# Patient Record
Sex: Male | Born: 1980 | Race: Black or African American | Hispanic: No | Marital: Single | Smoking: Never smoker
Health system: Southern US, Community
[De-identification: ages and names within clinical notes are randomized; demographics above are authoritative.]

## PROBLEM LIST (undated history)

## (undated) DIAGNOSIS — R569 Unspecified convulsions: Secondary | ICD-10-CM

## (undated) DIAGNOSIS — J309 Allergic rhinitis, unspecified: Secondary | ICD-10-CM

## (undated) DIAGNOSIS — F6381 Intermittent explosive disorder: Secondary | ICD-10-CM

## (undated) DIAGNOSIS — H52209 Unspecified astigmatism, unspecified eye: Secondary | ICD-10-CM

## (undated) DIAGNOSIS — F71 Moderate intellectual disabilities: Secondary | ICD-10-CM

## (undated) DIAGNOSIS — F840 Autistic disorder: Secondary | ICD-10-CM

## (undated) DIAGNOSIS — E871 Hypo-osmolality and hyponatremia: Secondary | ICD-10-CM

---
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
## 2003-07-27 ENCOUNTER — Emergency Department (HOSPITAL_COMMUNITY): Admission: EM | Admit: 2003-07-27 | Discharge: 2003-07-28 | Payer: Self-pay | Admitting: Emergency Medicine

## 2003-09-26 ENCOUNTER — Ambulatory Visit (HOSPITAL_COMMUNITY): Admission: RE | Admit: 2003-09-26 | Discharge: 2003-09-26 | Payer: Self-pay | Admitting: Specialist

## 2004-07-23 ENCOUNTER — Emergency Department (HOSPITAL_COMMUNITY): Admission: EM | Admit: 2004-07-23 | Discharge: 2004-07-23 | Payer: Self-pay | Admitting: Emergency Medicine

## 2004-09-21 ENCOUNTER — Ambulatory Visit (HOSPITAL_COMMUNITY): Admission: RE | Admit: 2004-09-21 | Discharge: 2004-09-21 | Payer: Self-pay | Admitting: Specialist

## 2005-11-09 ENCOUNTER — Ambulatory Visit (HOSPITAL_COMMUNITY): Admission: RE | Admit: 2005-11-09 | Discharge: 2005-11-09 | Payer: Self-pay | Admitting: Specialist

## 2006-04-13 ENCOUNTER — Ambulatory Visit (HOSPITAL_COMMUNITY): Admission: RE | Admit: 2006-04-13 | Discharge: 2006-04-13 | Payer: Self-pay | Admitting: Specialist

## 2006-11-15 ENCOUNTER — Ambulatory Visit (HOSPITAL_COMMUNITY): Admission: RE | Admit: 2006-11-15 | Discharge: 2006-11-15 | Payer: Self-pay | Admitting: Specialist

## 2007-11-22 ENCOUNTER — Ambulatory Visit (HOSPITAL_COMMUNITY): Admission: RE | Admit: 2007-11-22 | Discharge: 2007-11-22 | Payer: Self-pay | Admitting: Specialist

## 2007-11-23 ENCOUNTER — Emergency Department (HOSPITAL_COMMUNITY): Admission: EM | Admit: 2007-11-23 | Discharge: 2007-11-23 | Payer: Self-pay | Admitting: Emergency Medicine

## 2008-02-03 ENCOUNTER — Inpatient Hospital Stay (HOSPITAL_COMMUNITY): Admission: EM | Admit: 2008-02-03 | Discharge: 2008-02-06 | Payer: Self-pay | Admitting: Emergency Medicine

## 2008-02-25 ENCOUNTER — Emergency Department (HOSPITAL_COMMUNITY): Admission: EM | Admit: 2008-02-25 | Discharge: 2008-02-25 | Payer: Self-pay | Admitting: Emergency Medicine

## 2008-09-15 IMAGING — CT CT HEAD W/O CM
1 series · 16 of 30 positions shown, 20 images · non-contrast
Comparison: 02/05/2008

CLINICAL DATA: Syncope.  History of autism.  Seizure disorder.

CT HEAD WITHOUT CONTRAST
TECHNIQUE: Contiguous axial images were obtained from the base of
the skull through the vertex without contrast.

[Series 2: head routine 4.8 h37s · axial · 0.45mm/px · z∈[-49,+83]mm · 16 of 30 slices shown, 20 images]
[im 2/30  brain]
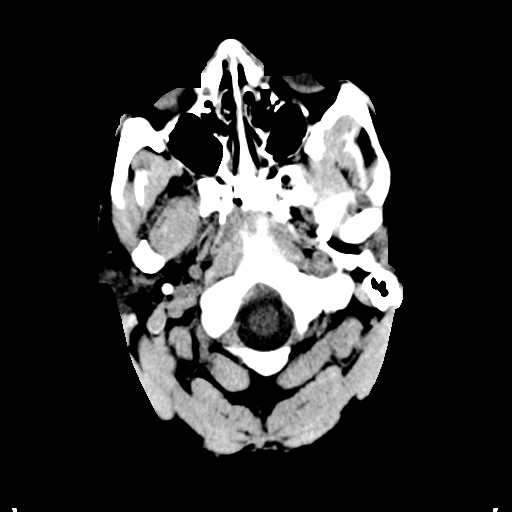
[im 2/30  bone]
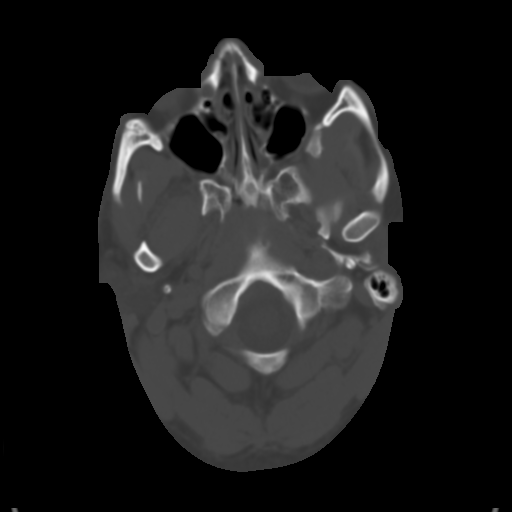
[im 4/30  brain]
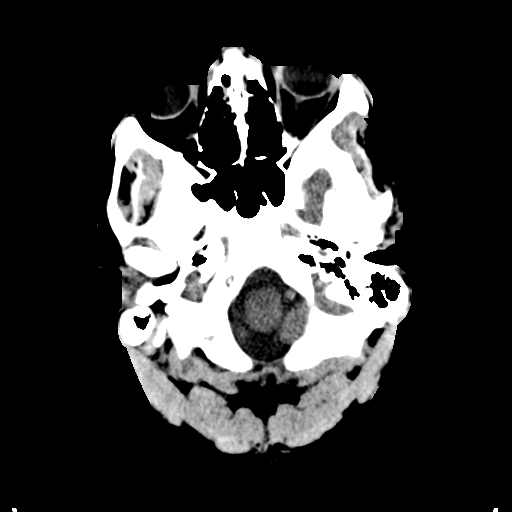
[im 6/30  brain]
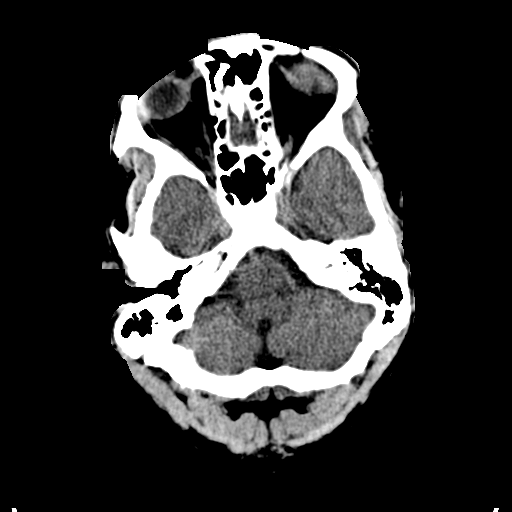
[im 8/30  brain]
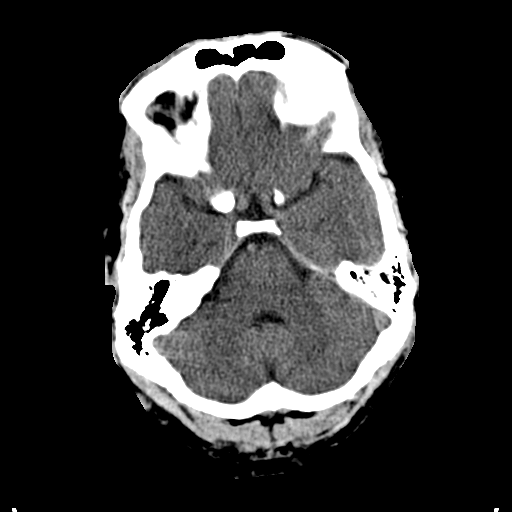
[im 9/30  brain]
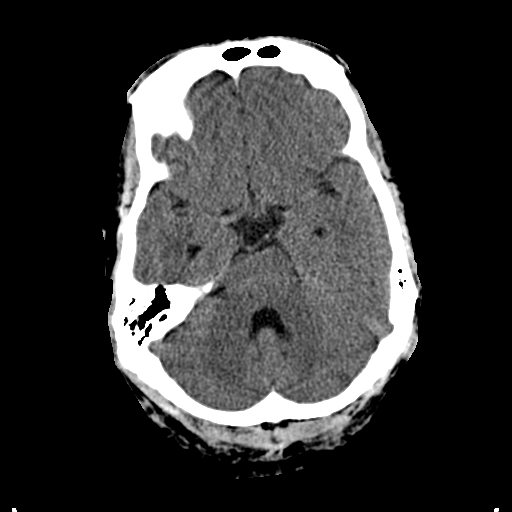
[im 9/30  bone]
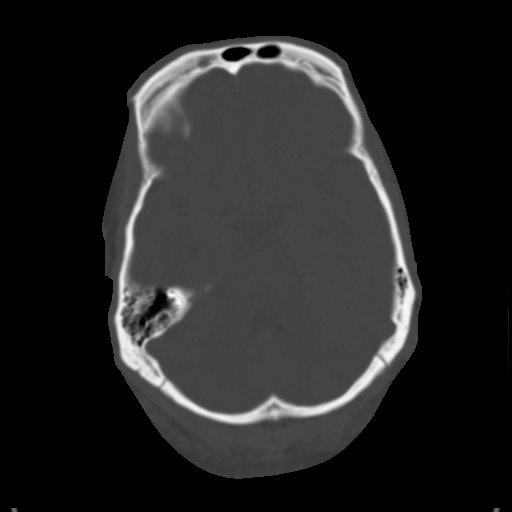
[im 11/30  brain]
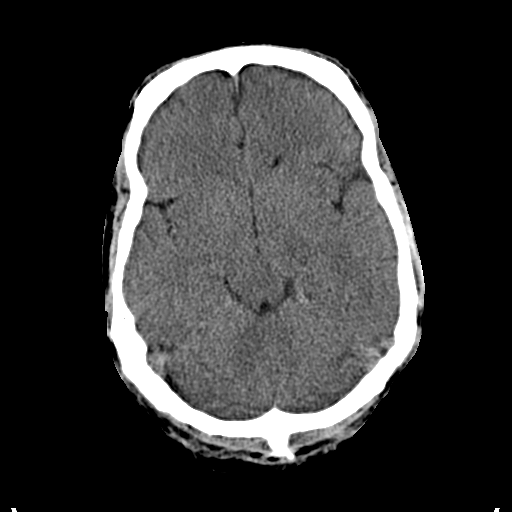
[im 13/30  brain]
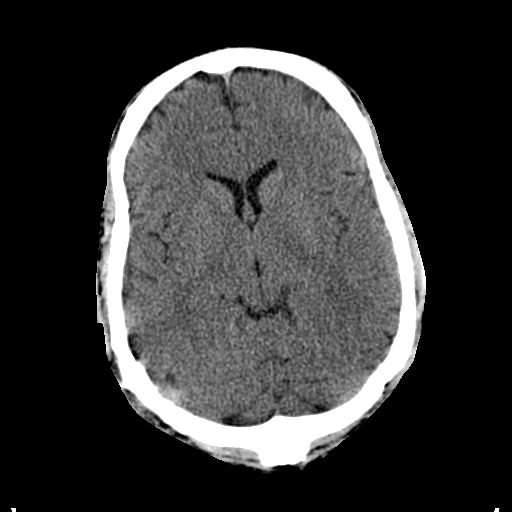
[im 15/30  brain]
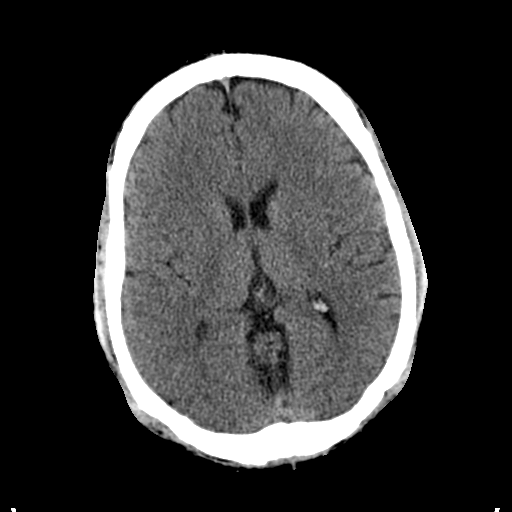
[im 16/30  brain]
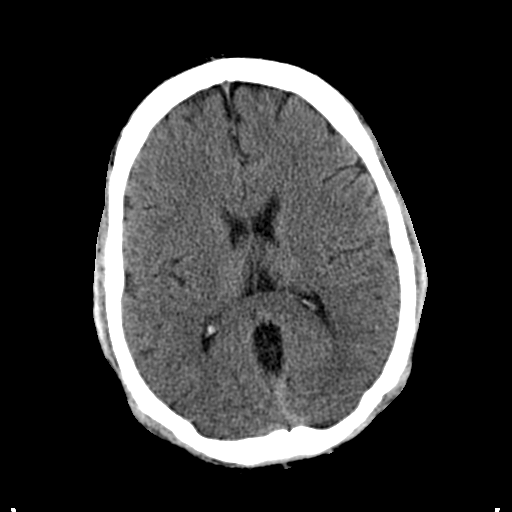
[im 16/30  bone]
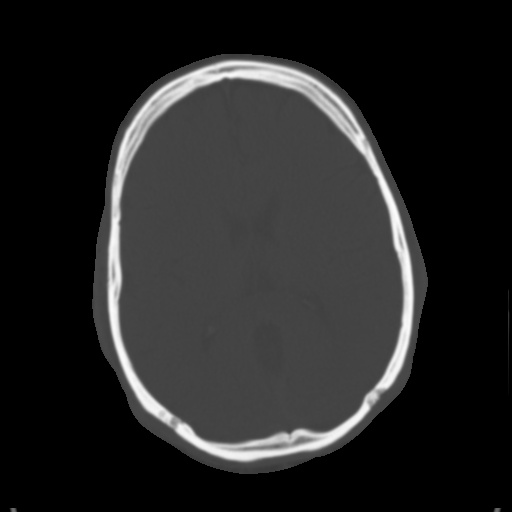
[im 18/30  brain]
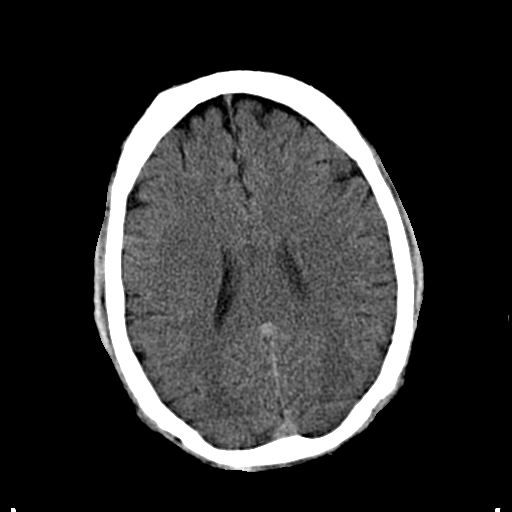
[im 20/30  brain]
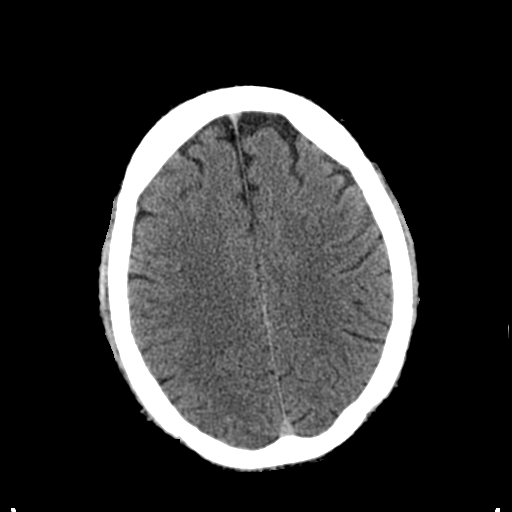
[im 22/30  brain]
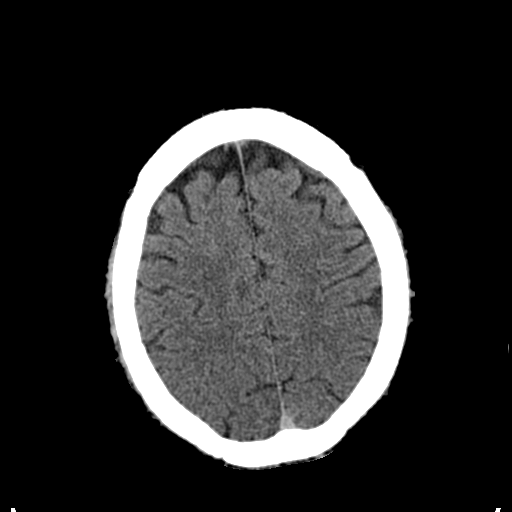
[im 23/30  brain]
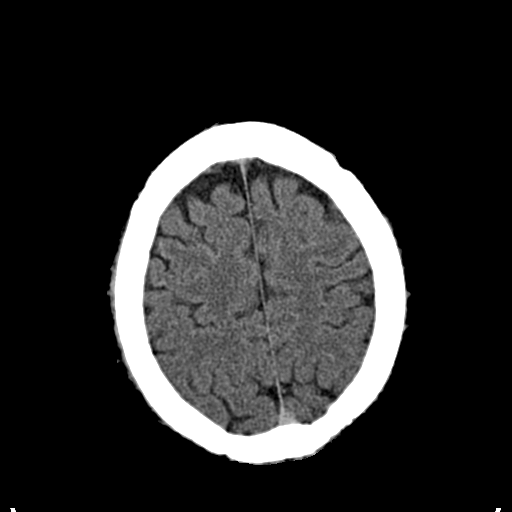
[im 23/30  bone]
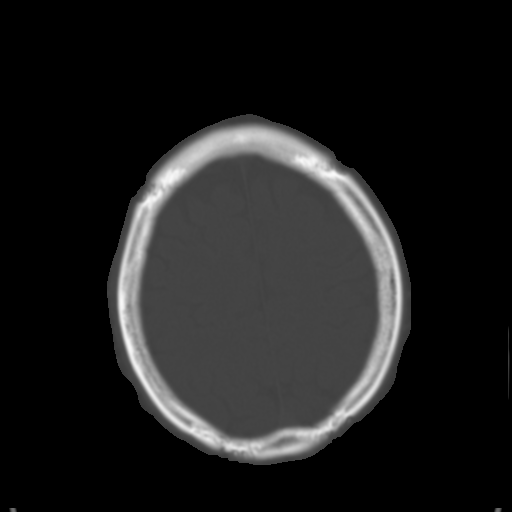
[im 25/30  brain]
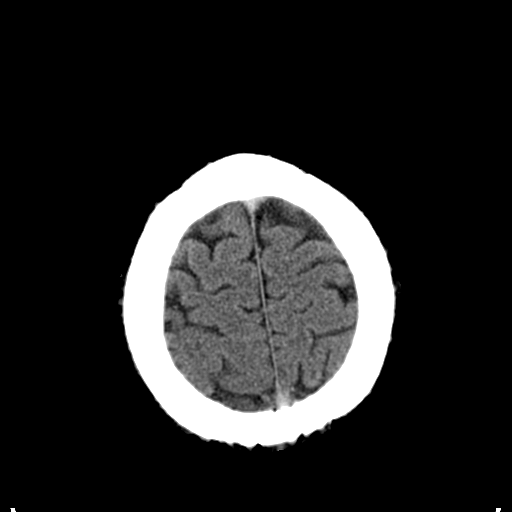
[im 27/30  brain]
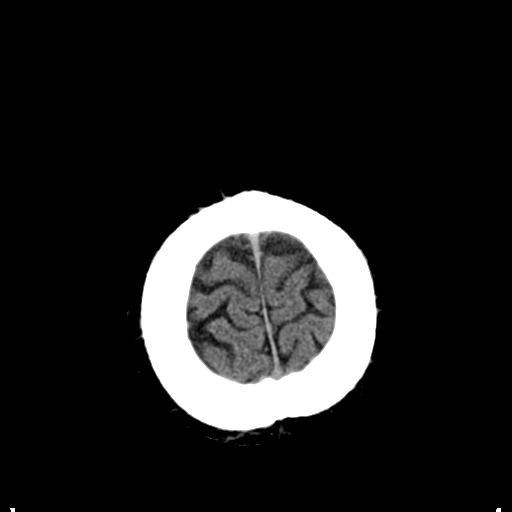
[im 29/30  brain]
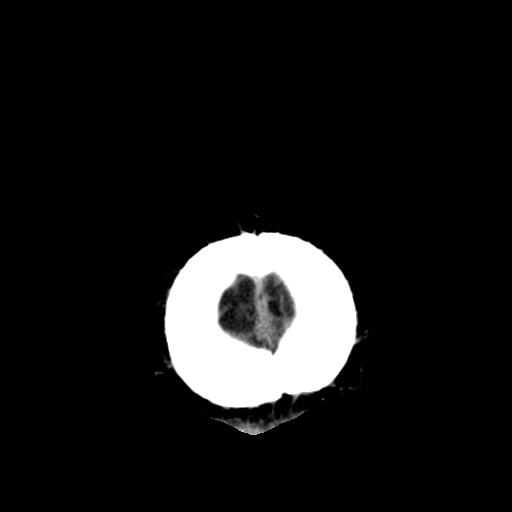

[16 of 30 positions shown; findings below may reference images not displayed]

FINDINGS: No acute intracranial abnormality is present.
Specifically, there is no evidence for acute infarct, hemorrhage,
mass, hydrocephalus, or extra-axial fluid collection.  The
paranasal sinuses and mastoid air cells are clear.  The globes and
orbits are intact.  The osseous skull is intact.
IMPRESSION: 1.  No acute intracranial abnormality or significant interval
change.

## 2009-07-13 ENCOUNTER — Emergency Department (HOSPITAL_COMMUNITY): Admission: EM | Admit: 2009-07-13 | Discharge: 2009-07-13 | Payer: Self-pay | Admitting: Emergency Medicine

## 2009-07-28 ENCOUNTER — Emergency Department (HOSPITAL_COMMUNITY): Admission: EM | Admit: 2009-07-28 | Discharge: 2009-07-28 | Payer: Self-pay | Admitting: Emergency Medicine

## 2009-11-03 ENCOUNTER — Ambulatory Visit (HOSPITAL_COMMUNITY): Admission: RE | Admit: 2009-11-03 | Discharge: 2009-11-03 | Payer: Self-pay | Admitting: Specialist

## 2010-02-01 IMAGING — CR DG ANKLE COMPLETE 3+V*R*
3 series · 3 of 3 positions shown · non-contrast
Comparison: None available.

CLINICAL DATA: Right ankle pain.

RIGHT ANKLE - COMPLETE 3+ VIEW

[t ankle joint ap right *]
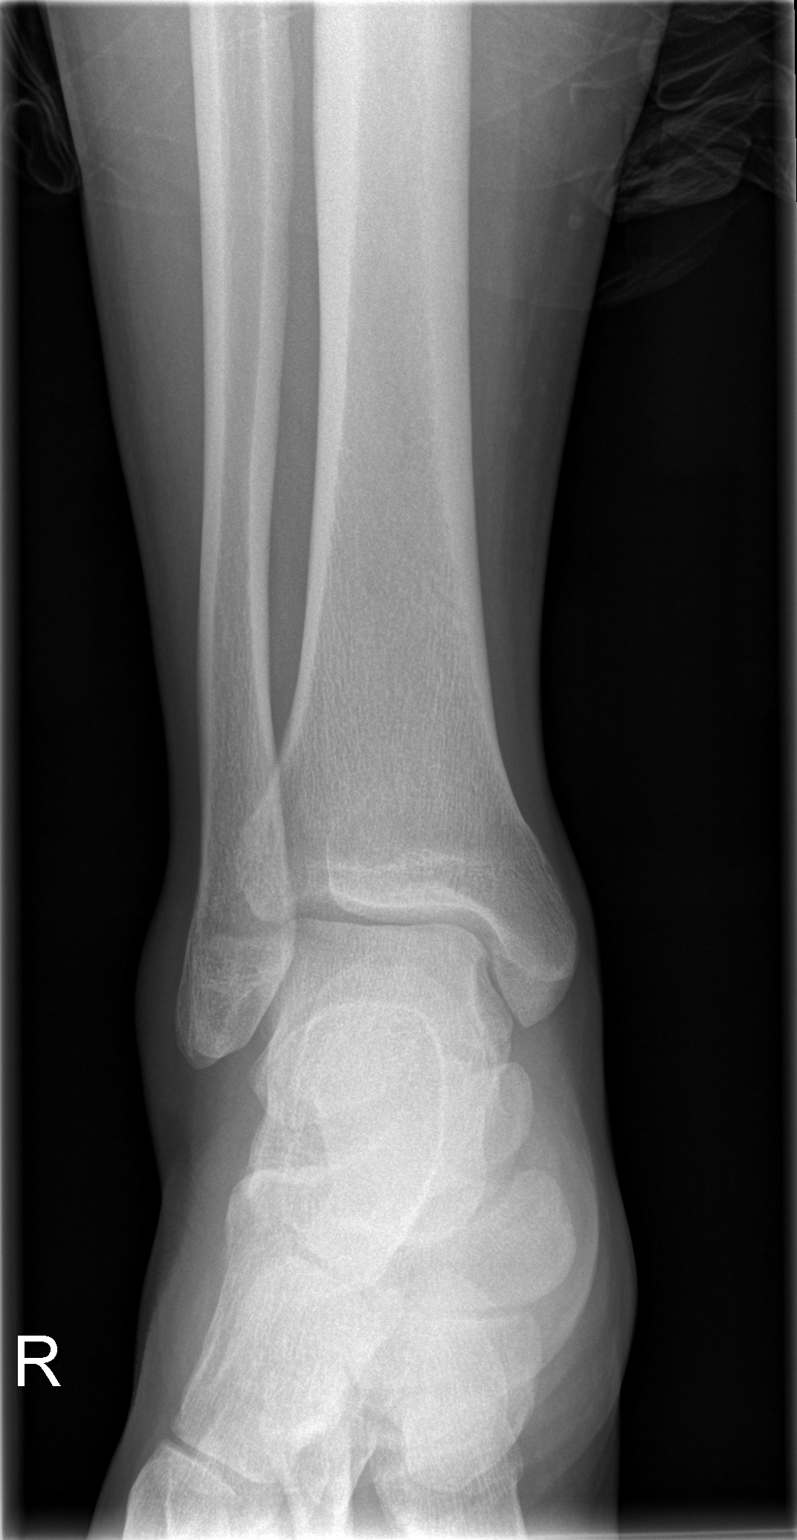

[t ankle joint oblique right *]
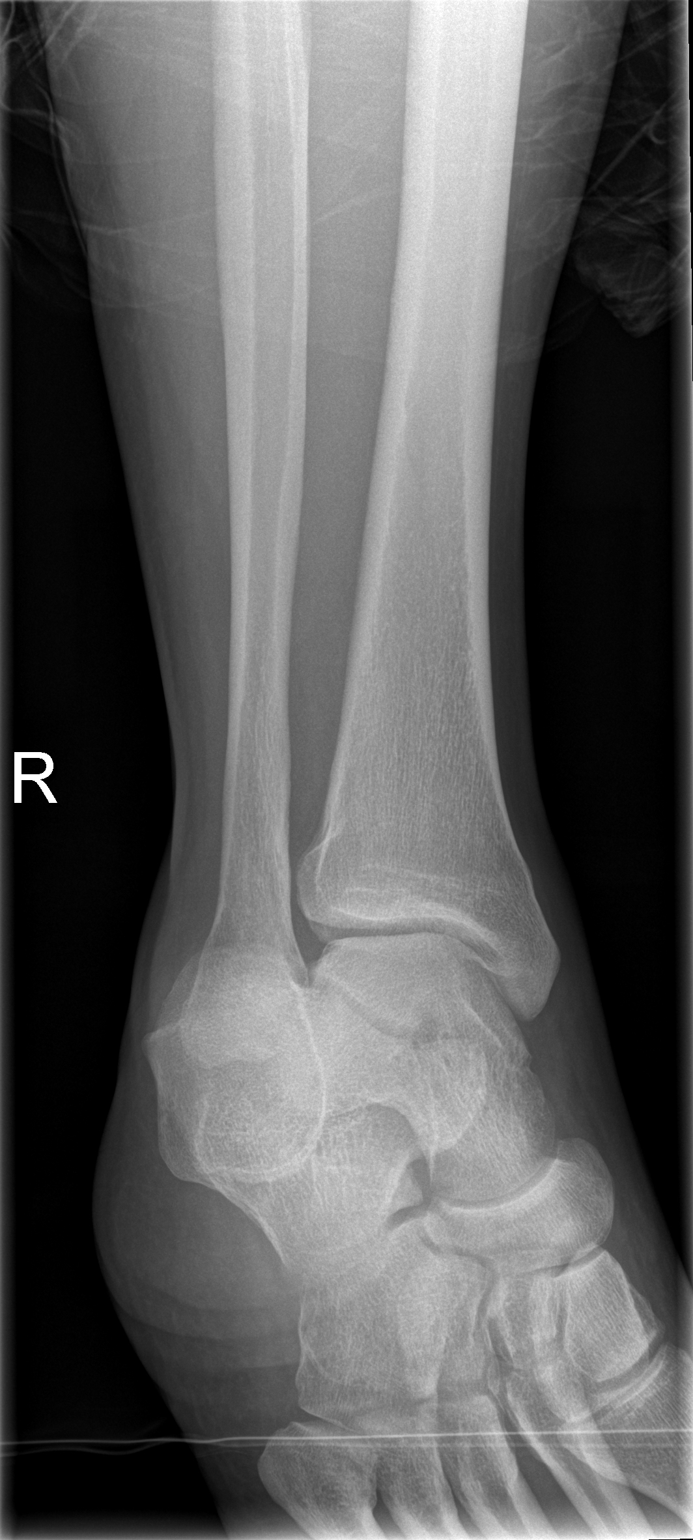

[t ankle joint lat right *]
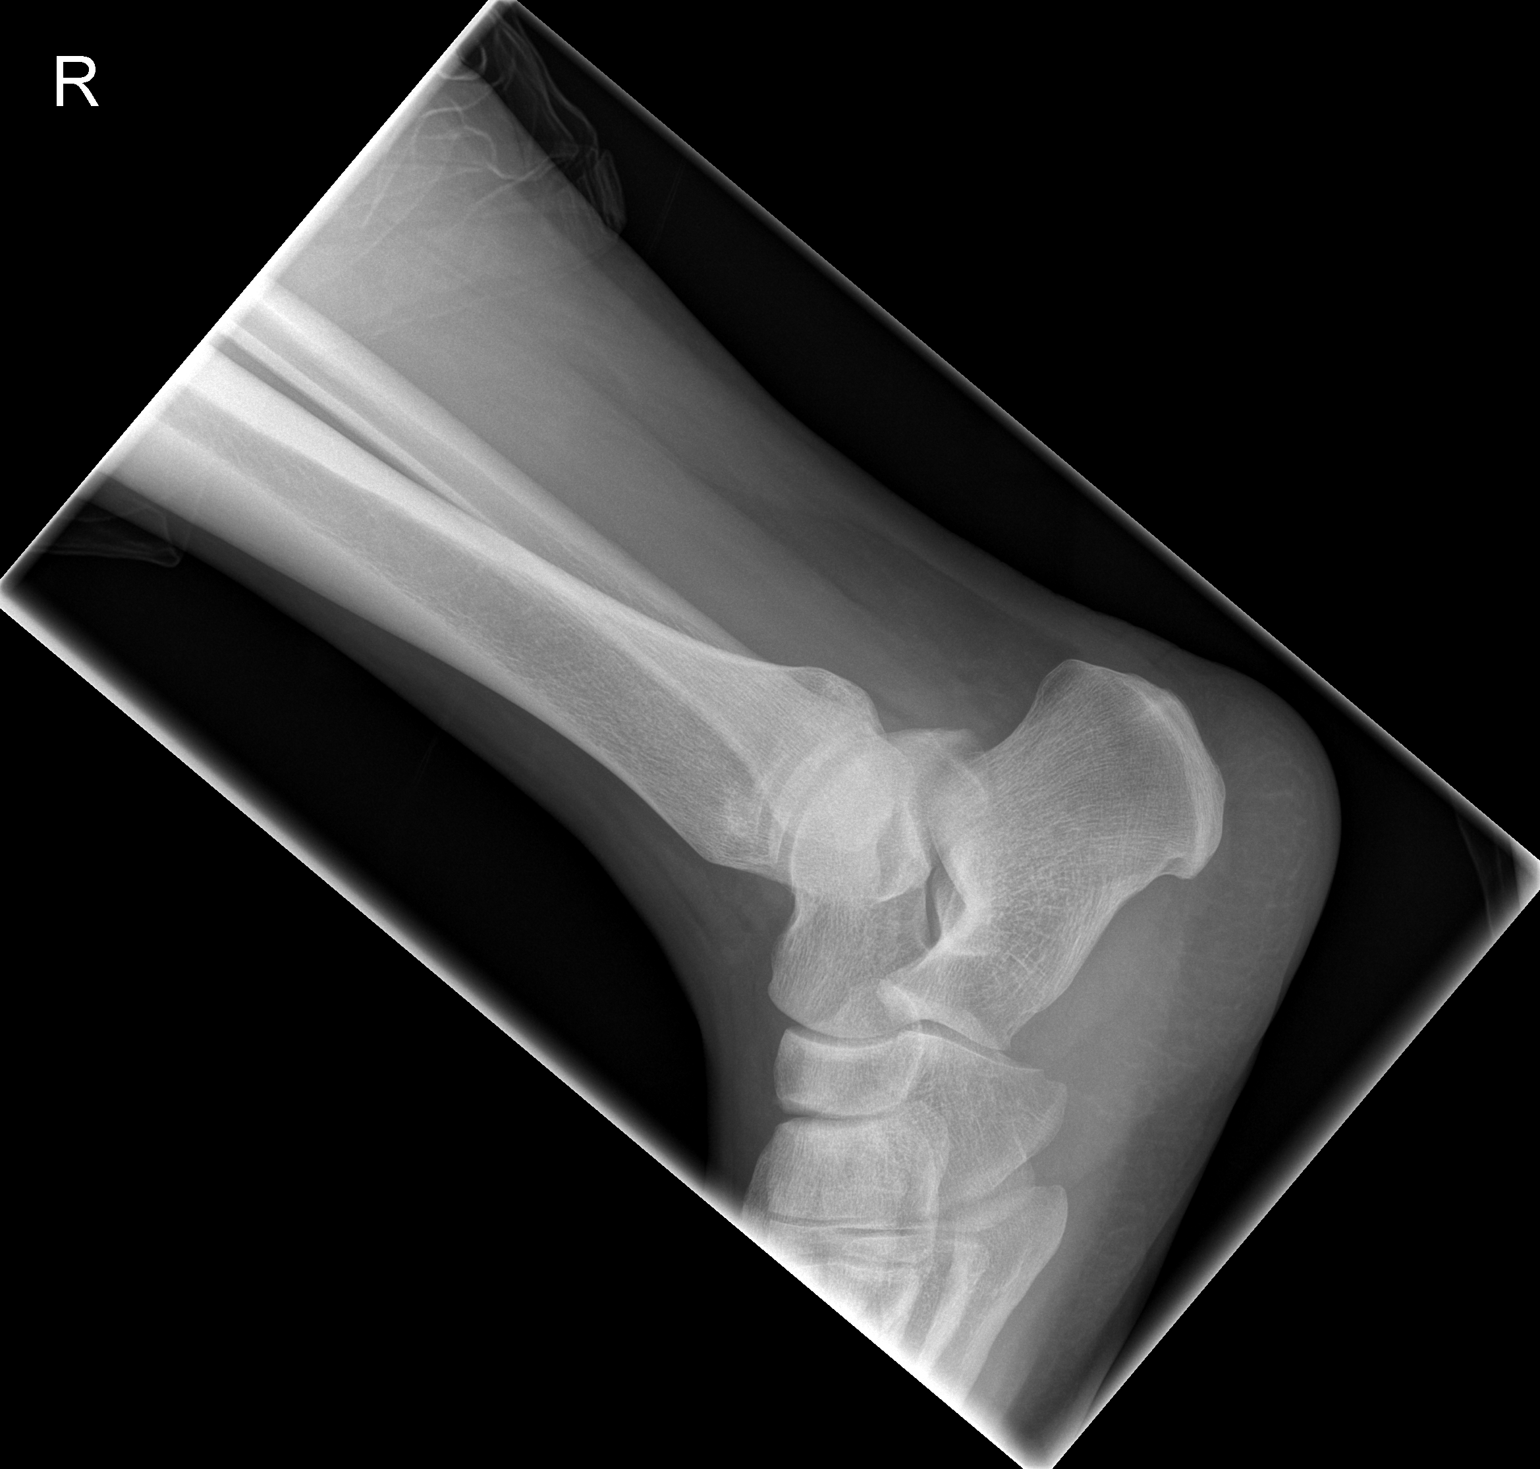

[3 of 3 positions shown; findings below may reference images not displayed]

FINDINGS: Soft tissue swelling is seen about the lateral malleolus.
No underlying fracture or dislocation is identified.
IMPRESSION: Lateral soft tissue swelling without underlying acute bony
abnormality.

## 2010-02-03 ENCOUNTER — Emergency Department (HOSPITAL_BASED_OUTPATIENT_CLINIC_OR_DEPARTMENT_OTHER): Admission: EM | Admit: 2010-02-03 | Discharge: 2010-02-03 | Payer: Self-pay | Admitting: Emergency Medicine

## 2010-08-13 ENCOUNTER — Emergency Department (HOSPITAL_COMMUNITY)
Admission: EM | Admit: 2010-08-13 | Discharge: 2010-08-13 | Payer: Self-pay | Source: Home / Self Care | Admitting: Emergency Medicine

## 2010-11-16 LAB — BASIC METABOLIC PANEL
CO2: 29 mEq/L (ref 19–32)
Calcium: 8.4 mg/dL (ref 8.4–10.5)
Calcium: 8.9 mg/dL (ref 8.4–10.5)
Chloride: 98 mEq/L (ref 96–112)
GFR calc Af Amer: >60 mL/min (ref >60)
Glucose, Bld: 103 mg/dL — ABNORMAL HIGH (ref 70–99)
Glucose, Bld: 88 mg/dL (ref 70–99)
Potassium: 4 mEq/L (ref 3.5–5.1)
Sodium: 129 mEq/L — ABNORMAL LOW (ref 135–145)
Sodium: 131 mEq/L — ABNORMAL LOW (ref 135–145)

## 2010-11-16 LAB — CARBAMAZEPINE LEVEL, TOTAL: Carbamazepine Lvl: 9.6 ug/mL (ref 4.0–12.0)

## 2010-11-16 LAB — URINALYSIS, ROUTINE W REFLEX MICROSCOPIC
Bilirubin Urine: NEGATIVE
Glucose, UA: NEGATIVE mg/dL
Specific Gravity, Urine: 1.012 (ref 1.005–1.030)
pH: 6.5 (ref 5.0–8.0)

## 2010-11-27 ENCOUNTER — Ambulatory Visit (HOSPITAL_COMMUNITY)
Admission: RE | Admit: 2010-11-27 | Discharge: 2010-11-27 | Disposition: A | Discharge status: Home / Self Care | Payer: Medicaid Other | Source: Ambulatory Visit | Attending: Specialist | Admitting: Specialist

## 2010-11-27 DIAGNOSIS — Z79899 Other long term (current) drug therapy: Secondary | ICD-10-CM | POA: Insufficient documentation

## 2010-12-09 LAB — POCT I-STAT, CHEM 8
Calcium, Ion: 1.16 mmol/L (ref 1.12–1.32)
Chloride: 92 mEq/L — ABNORMAL LOW (ref 96–112)
Creatinine, Ser: 0.6 mg/dL (ref 0.4–1.5)
Glucose, Bld: 81 mg/dL (ref 70–99)
HCT: 44 % (ref 39.0–52.0)
Sodium: 129 mEq/L — ABNORMAL LOW (ref 135–145)

## 2010-12-09 LAB — CBC
HCT: 40.9 % (ref 39.0–52.0)
Platelets: 156 10*3/uL (ref 150–400)
RBC: 4.78 MIL/uL (ref 4.22–5.81)
RDW: 13.4 % (ref 11.5–15.5)

## 2010-12-09 LAB — COMPREHENSIVE METABOLIC PANEL
ALT: 16 U/L (ref 0–53)
AST: 23 U/L (ref 0–37)
Albumin: 4.2 g/dL (ref 3.5–5.2)
Alkaline Phosphatase: 51 U/L (ref 39–117)
BUN: 9 mg/dL (ref 6–23)
CO2: 28 mEq/L (ref 19–32)
Calcium: 9.1 mg/dL (ref 8.4–10.5)
GFR calc Af Amer: >60 mL/min (ref >60)
Potassium: 4 mEq/L (ref 3.5–5.1)
Sodium: 128 mEq/L — ABNORMAL LOW (ref 135–145)
Total Protein: 7 g/dL (ref 6.0–8.3)

## 2010-12-09 LAB — DIFFERENTIAL
Basophils Relative: 1 % (ref 0–1)
Lymphs Abs: 2 10*3/uL (ref 0.7–4.0)
Monocytes Relative: 12 % (ref 3–12)
Neutro Abs: 1.9 10*3/uL (ref 1.7–7.7)

## 2010-12-21 ENCOUNTER — Emergency Department (HOSPITAL_BASED_OUTPATIENT_CLINIC_OR_DEPARTMENT_OTHER)
Admission: EM | Admit: 2010-12-21 | Discharge: 2010-12-21 | Disposition: A | Discharge status: Home / Self Care | Payer: No Typology Code available for payment source | Attending: Emergency Medicine | Admitting: Emergency Medicine

## 2010-12-21 DIAGNOSIS — Z043 Encounter for examination and observation following other accident: Secondary | ICD-10-CM | POA: Insufficient documentation

## 2011-01-19 NOTE — Discharge Summary (Signed)
Hunter Garrett, Hunter Garrett         ACCOUNT NO.:  [id number]   MEDICAL RECORD NO.:  [id number]          PATIENT TYPE:  INP   LOCATION:  3315                         FACILITY:  [facility]   PHYSICIAN:  Hind I Elsaid, MD      DATE OF BIRTH:  1980-10-28   DATE OF ADMISSION:  02/03/2008  DATE OF DISCHARGE:  02/06/2008                               DISCHARGE SUMMARY   PRIMARY CARE PHYSICIAN:  Dr. Earlene Plater.   DISCHARGE DIAGNOSES:  1. Hyponatremia, resolved, felt to be secondary to medication, mainly      Tegretol and fluvoxamine.  2. Recurrent seizure.  3. History of seizure disorder.  4. Moderate mental retardation.  5. High functional autism.   MEDICATIONS:  1. Fluvoxamine 50 mg in the morning.  2. Fluvoxamine 100 mg at bedtime.  3. Keppra 750 mg at 8 a.m. and 8 p.m.  4. Simvastatin 3 tablets orally 3 times daily at 8 a.m., 4 p.m., and 8      p.m.  5. Abilify 30 mg 1 tab at bedtime for explosive behavior.  6. Ativan 1.5 mg at bedtime for behavior.  7. Thorazine 25 mg 3 tabs at bedtime for behavior control.  8.tegretol150 mg po tid   CONSULTATION:  Neurology consulted for evaluation of recurrent seizure.   PROCEDURES:  1. CT head, no acute intracranial abnormality.  2. EEG, result is pending.   This is a 30 year old male with high functional autism, sent from a  group home for low sodium.  The lowest number was 124 and 118.  The  patient was asymptomatic.  The patient was sent to the emergency room  without his medications and the patient cannot tell what kind of  medication he has been on.  The patient admitted to the hospital for  evaluation of hyponatremia.  Medication reconciliation was brought from  the hospital, from the patient home group, found to be on Tegretol and  fluvoxamine.  Both of them will induce hyponatremia.  We felt  hyponatremia is secondary to medication.  We stopped the patient's IV  fluid and restrict his free water intake to less than 1 liter.  Sodium  improved to 133 today.  We advised home group to restrict free water  from the patient to less than 1 liter.  To consider demeclocycline if  hyponatremia become an issue.  We agree with Thermotabs salt.   1. Seizure.  The patient had experienced tonic-clonic seizure during      hospitalization, and for that, neurology consult done by Dr.      Ellison Carwin.  We felt that the seizure is secondary to the      patient, we did not know his medication at the date of admission,      and the patient just received Tegretol 200 mg at bedtime.  We      contacted the home group and we got the list of medications.  The      patient received his seizure medication with no further episode of      seizure.  The patient has CT head, which was negative.  We will  defer adjustment of seizure medication to the patient's      neurologist.  The patient is very sensitive to the above      medications, and we do not advice to change his seizure medication.      daughter completely understand.  2. Autism, high functional, to continue with his behavior medication.      We felt that the patient is medically stable to be discharged to      home group.  We advised the home group physician to consider      restricting his free water intake to less than 1 liter and to      consider demeclocycline, if sodium remains an issue.  At this time,      the patient is asymptomatic and sodium at this time is 133.      [name], MD  Electronically Signed     HIE/MEDQ  D:  02/06/2008  T:  02/07/2008  Job:  [id number]

## 2011-01-19 NOTE — Procedures (Signed)
EEG NUMBER:  [id number].   CLINICAL HISTORY:  The patient is a 30 year old with a history of high-  functioning autism who was noted to have hyponatremia on Tegretol and  fluvoxamine.  The patient had seizures for the first time in 2 years.  The patient states the Tegretol was unknown and was not being given  correctly.  The event was a generalized tonic-clonic seizure, 345.10,  with partial onset, 345.40.   PROCEDURE:  The tracing is carried out on a 32-channel digital Cadwell  recorder reformatted into 16-channel montages with one devoted to EKG.  The patient was awake and drowsy during the recording.  The  International 10-20 system lead placement was used.   MEDICATIONS:  Carbamazepine, Levetiracetam, aripiprazole, fluvoxamine,  and lorazepam.  The International 10-20 system lead placement was used.   DESCRIPTION OF FINDINGS:  Dominant frequency is 8-9 Hz, 20 microvolt  activity that is well regulated.  Background activity is predominantly  alpha of low voltage.  Some beta range activity was superimposed.  Photic stimulation induced driving response from 3-9 Hz.  There was no  interictal epileptiform activity in the form of spikes or sharp waves.  EKG showed regular sinus rhythm with ventricular response of 78 beats  per minute.   IMPRESSION:  Normal waking record.      Deanna Artis. Sharene Skeans, M.D.  Electronically Signed     EAV:[id number]  D:  02/05/2008 23:37:04  T:  02/06/2008 03:43:55  Job #:  [id number]

## 2011-01-19 NOTE — Consult Note (Signed)
Hunter Garrett, Hunter Garrett         ACCOUNT NO.:  [id number]   MEDICAL RECORD NO.:  [id number]          PATIENT TYPE:  INP   LOCATION:  3315                         FACILITY:  [facility]   PHYSICIAN:  Deanna Artis. Hickling, M.D.DATE OF BIRTH:  1981/08/18   DATE OF CONSULTATION:  02/05/2008  DATE OF DISCHARGE:                                 CONSULTATION   CHIEF COMPLAINT:  Recurrent seizures.   HISTORY OF PRESENT CONDITION:  Key, he is a 30 year old young man with  high-functioning autism, who lives in a group home.  He is transferred  to [facility] after routine sodium was found to be 118.  Etiology of this was unclear; however, the patient takes both  carbamazepine and fluvoxamine, both of which cause hyponatremia.   In the emergency room, the patient's sodium was 124.  He was hydrated  with normal saline, and the sodium dropped to 121.  The patient was  admitted to [facility] for further observation.   Initially, his medicines in the group home were not available.  He had  an initial carbamazepine level of 6.7, which was carried out on April 18, 2008.  He may not have received his Tegretol the way that he should.  He was given 200 mg last night, and his dosage is supposed to be 150 mg  3 times daily.   This morning, the patient had an episode of unresponsive staring  followed by generalized tonic-clonic seizure activity.  The patient was  postictal.  I was asked to see the patient to determine the etiology of  this dysfunction and make recommendations for further workup and  treatment.   The patient has had high-functioning autism and is currently in a group  home with 3 other clients.   CURRENT MEDICATIONS:  Include:  1. Fluvoxamine 50 mg 1 tablet in the morning and 2 tablets at      nighttime.  2. Levetiracetam 250 mg twice daily.  3. Carbamazepine 100 mg one and a half tablets 3 times daily.  4. Thermotabs Salt 3 tablets 3 times daily.  5. Aripiprazole 7  tablets at bedtime.  6. Lorazepam 1 mg one-half tablets at bedtime.   ALLERGIES:  No known allergies to drugs.   FAMILY HISTORY:  Negative for seizures, mental retardation, blindness,  deafness, birth defects, or autism.   SOCIAL HISTORY:  The patient does not use tobacco, alcohol, or drugs.   REVIEW OF SYSTEMS:  The patient has not had recent weight loss or weight  gain.  He has not had headache, blurred vision, or sore throat.  He has  not had chest pain or palpitations.  RESPIRATORY:  No shortness of  breath or cough.  GASTROINTESTINAL:  No abdominal pain, nausea,  vomiting, diarrhea, or constipation.  EXTREMITIES:  No numbness or  tingling.  GENERAL:  The patient has had normal sleep and has not taken  excessive amounts of fluid.   PHYSICAL EXAMINATION:  GENERAL:  Today, pleasant young man, somewhat  sleepy, in no acute distress.  VITAL SIGNS:  Temperature 98.2, blood pressure 101/57, resting pulse  101, respirations 16, and oxygen saturation  99% on room air.  EAR, NOSE, AND THROAT:  He has wax in his left ear, normal right ear.  No infections.  No bruits.  LUNGS:  Clear.  HEART:  No murmurs.  Pulses are normal.  ABDOMEN:  Soft.  Bowel sounds are normal.  EXTREMITIES:  Normal.  NEUROLOGIC:  Mental Status: The patient has high-functioning autism.  Nonetheless, he is able to name objects and follow commands.  He is bit  sleepy (postictal).  Cranial Nerves:  Round and reactive pupils.  Visual  fields are full to double simultaneous stimuli.  Symmetric facial  strength.  Midline tongue.  Bilateral eyelid ptosis, right greater than  left.  Congenital midline tongue and uvula, air conduction greater than  bone conduction bilaterally.   Motor Examination:  Normal strength.  No drift.  Fine motor movements  are clumsy, no focality.  Sensory Examination:  Intact cold,  proprioception, and stereognosis.  Cerebellar Examination:  Good finger-  to-nose.  No tremor.  Gait not tested.   Deep tendon reflexes are  diminished.  The patient has bilateral flexor plantar responses.   IMPRESSION:  1. Breakthrough generalized tonic-clonic seizure of partial onset,      unknown etiology. (345.40, 345.10)  This may represent change in      his treatment regimen.  His sister who is at bedside says that he      is very sensitive to that.  2. Hyponatremia is related to the joint effects of carbamazepine and      fluvoxamine.  I presume this has been going on for a while because      the patient has been receiving supplemental sodium.  3. The patient has high-functioning autism on nonlateralized      examination. (299.80)   PLAN:  Since this patient we do not follow regularly, baseline CT scan  and EEG are recommended.  I am reluctant to change the medications.  The  patient's sodium of 118 was low, but he was asymptomatic.  His sister,  DeeDee, says that his seizures occur when he fails to take his  medications as ordered.  I am going to restart his Tegretol, gave him  all of his doses today, now at 11:30, again at 4, and again at 8 p.m.  and continue his Keppra.  I appreciate the opportunity to participate in  his care.      Deanna Artis. Sharene Skeans, M.D.  Electronically Signed     WHH/MEDQ  D:  02/05/2008  T:  02/06/2008  Job:  [id number]   cc:   [name], MD  Arville Go, MD

## 2011-01-19 NOTE — H&P (Signed)
[name], [name]         ACCOUNT NO.:  [id number]   MEDICAL RECORD NO.:  [id number]          PATIENT TYPE:  EMS   LOCATION:  MAJO                         FACILITY:  [facility]   PHYSICIAN:  Madaline Savage, MD        DATE OF BIRTH:  28-Nov-1980   DATE OF ADMISSION:  02/03/2008  DATE OF DISCHARGE:                              HISTORY & PHYSICAL   PRIMARY CARE PHYSICIAN:  Dr. Jeri Lager.  This patient is unassigned to [place].   CHIEF COMPLAINT:  Abnormal lab.   HISTORY OF PRESENT ILLNESS:  [name] is a 30 year old gentleman  who has a history of mental retardation who was sent from his group home  with a low sodium.  Apparently his sodium was checked at the group home  and was apparently 118 a few days ago, so they decided to send him to  the ER.  In the ER, his initial sodium was 124.  He was hydrated with  normal saline in the ER, and when they rechecked his sodium it had  come  down to 121, so we were asked to admit this patient.  At this time, he  is awake.  He denies having any complaints.  He denies drinking too much  liquid.  He denies any symptoms at all.   PAST MEDICAL HISTORY:  Significant for mental retardation.   ALLERGIES:  No known drug allergies.   CURRENT MEDICATIONS:  He tells me that he does take medications at home,  but he does not know how many or which medications he takes.  He does  not have any family with him at this time.   SOCIAL HISTORY:  No history of smoking or alcohol or drug abuse.   FAMILY HISTORY:  Noncontributory.   REVIEW OF SYSTEMS:  GENERAL:  He denies any recent weight loss or weight  gain.  HEENT:  Denies any headache.  No blurred vision or sore throat.  CARDIOVASCULAR:  Denies chest pain, palpitations.  RESPIRATORY:  No shortness of breath, cough.  GASTROINTESTINAL:  No abdominal pain, nausea, vomiting, diarrhea,  constipation.  EXTREMITIES:  No tingling or numbness of toes or fingers.   PHYSICAL EXAMINATION:  GENERAL:  He is alert  and oriented x 3.  VITAL SIGNS:  Temperature is 97.1, pulse rate of 77 per minute, blood  pressure is 115/55, oxygen saturation of 100% on room air.  HEENT:  Head atraumatic, normocephalic.  Pupils are bilaterally equal  and reactive to light.  Mucous membranes appear moist.  NECK:  Supple.  No JVD, no carotid bruit.  CARDIOVASCULAR:  S1 and S2 heard.  Regular rhythm.  CHEST:  Clear to auscultation.  ABDOMEN:  Soft.  Bowel sounds heard.  EXTREMITIES:  No cyanosis or clubbing.   Labs show a white count of 3.3, hemoglobin 14.1, platelets 144.  Sodium  124, potassium 4.8, chloride 91, bicarb 27, BUN 9, creatinine 0.8,  glucose 84.  His repeat sodium was 121.   IMPRESSION:  1. Hyponatremia.  2. Leukopenia.  3. Mental retardation.   PLAN:  This is a 30 year old gentleman who has a history of mental  retardation who now comes with hyponatremia which is refractory to IV  fluids.  At this time, my main differential diagnosis is medication  versus SIADH versus polydipsia.  At this time, he denies drinking too  much water.  We do not know his home medications.  We will try to obtain  his home medications to see if he is on any medications which can cause  hyponatremia.  I will hydrate him gently with normal saline at this  time.  I will also put a fluid restriction of 1.8 liters a day.  I will  get a urine and serum osmolality on him, and I will also get urine  electrolytes.  At this time, we will watch his white count to see if it  does come back up.  I will put him on DVT prophylaxis.      Madaline Savage, MD  Electronically Signed     PKN/MEDQ  D:  02/03/2008  T:  02/03/2008  Job:  [id number]

## 2011-06-02 LAB — CBC
HCT: 41.4
HCT: 42.5
Hemoglobin: 14.6
MCHC: 34.2
MCV: 83.3
Platelets: 144 — ABNORMAL LOW
RDW: 13.4
RDW: 13.5

## 2011-06-02 LAB — DIFFERENTIAL
Basophils Absolute: 0
Basophils Relative: 0
Eosinophils Absolute: 0.1
Eosinophils Absolute: 0.2
Eosinophils Relative: 5
Eosinophils Relative: 5
Lymphocytes Relative: 19
Lymphs Abs: 0.6 — ABNORMAL LOW
Lymphs Abs: 0.9
Monocytes Relative: 19 — ABNORMAL HIGH
Monocytes Relative: 20 — ABNORMAL HIGH
Neutrophils Relative %: 57

## 2011-06-02 LAB — BASIC METABOLIC PANEL
BUN: 9
Creatinine, Ser: 0.88
Sodium: 124 — ABNORMAL LOW

## 2011-06-02 LAB — POCT I-STAT, CHEM 8
BUN: 10
Creatinine, Ser: 1.2
HCT: 43
Potassium: 5.2 — ABNORMAL HIGH
TCO2: 26

## 2011-06-02 LAB — COMPREHENSIVE METABOLIC PANEL
ALT: 19
Alkaline Phosphatase: 49
CO2: 29
Creatinine, Ser: 0.9
GFR calc Af Amer: >60
Sodium: 128 — ABNORMAL LOW
Total Bilirubin: 0.5
Total Protein: 6.1

## 2011-06-02 LAB — NA AND K (SODIUM & POTASSIUM), RAND UR: Sodium, Ur: 27

## 2011-06-02 LAB — OSMOLALITY, URINE: Osmolality, Ur: 281 — ABNORMAL LOW

## 2011-06-02 LAB — OSMOLALITY: Osmolality: 255 — ABNORMAL LOW

## 2011-06-03 LAB — URINALYSIS, ROUTINE W REFLEX MICROSCOPIC
Ketones, ur: 15 — AB
Nitrite: NEGATIVE
Protein, ur: NEGATIVE
Urobilinogen, UA: 1
pH: 6

## 2011-06-03 LAB — BASIC METABOLIC PANEL
BUN: 7
CO2: 29
Chloride: 93 — ABNORMAL LOW
Creatinine, Ser: 0.93
GFR calc non Af Amer: >60
Glucose, Bld: 118 — ABNORMAL HIGH
Potassium: 3.7
Sodium: 131 — ABNORMAL LOW

## 2011-06-03 LAB — CBC
HCT: 42.7
Hemoglobin: 14.3
Platelets: 144 — ABNORMAL LOW
RBC: 5.09
RDW: 13.6

## 2011-06-03 LAB — COMPREHENSIVE METABOLIC PANEL
ALT: 19
Alkaline Phosphatase: 47
CO2: 27
Chloride: 99
GFR calc non Af Amer: >60
Glucose, Bld: 92
Potassium: 3.8
Sodium: 133 — ABNORMAL LOW
Total Protein: 6.7

## 2011-06-03 LAB — DIFFERENTIAL
Basophils Absolute: 0
Eosinophils Relative: 2
Lymphocytes Relative: 19

## 2011-06-03 LAB — POCT I-STAT, CHEM 8
Calcium, Ion: 0.97 — ABNORMAL LOW
Chloride: 100
HCT: 45
Hemoglobin: 15.3
TCO2: 25

## 2011-06-03 LAB — CARBAMAZEPINE LEVEL, TOTAL
Carbamazepine Lvl: 10
Carbamazepine Lvl: 9.1

## 2011-06-11 ENCOUNTER — Emergency Department (HOSPITAL_COMMUNITY)
Admission: EM | Admit: 2011-06-11 | Discharge: 2011-06-12 | Disposition: A | Discharge status: Home / Self Care | Payer: Medicaid Other | Attending: Emergency Medicine | Admitting: Emergency Medicine

## 2011-06-11 DIAGNOSIS — F840 Autistic disorder: Secondary | ICD-10-CM | POA: Insufficient documentation

## 2011-06-11 DIAGNOSIS — E236 Other disorders of pituitary gland: Secondary | ICD-10-CM | POA: Insufficient documentation

## 2011-06-11 DIAGNOSIS — F79 Unspecified intellectual disabilities: Secondary | ICD-10-CM | POA: Insufficient documentation

## 2011-06-11 DIAGNOSIS — G40909 Epilepsy, unspecified, not intractable, without status epilepticus: Secondary | ICD-10-CM | POA: Insufficient documentation

## 2011-06-11 LAB — URINALYSIS, ROUTINE W REFLEX MICROSCOPIC
Bilirubin Urine: NEGATIVE
Ketones, ur: NEGATIVE mg/dL
Nitrite: NEGATIVE
Protein, ur: NEGATIVE mg/dL
Urobilinogen, UA: 0.2 mg/dL (ref 0.0–1.0)

## 2011-06-11 LAB — POCT I-STAT, CHEM 8
Calcium, Ion: 1.17 mmol/L (ref 1.12–1.32)
Calcium, Ion: 1.16 mmol/L (ref 1.12–1.32)
Calcium, Ion: 1.08 mmol/L — ABNORMAL LOW (ref 1.12–1.32)
Creatinine, Ser: 1 mg/dL (ref 0.50–1.35)
Creatinine, Ser: 0.7 mg/dL (ref 0.50–1.35)
Glucose, Bld: 85 mg/dL (ref 70–99)
Glucose, Bld: 92 mg/dL (ref 70–99)
HCT: 46 % (ref 39.0–52.0)
HCT: 44 % (ref 39.0–52.0)
Hemoglobin: 15.6 g/dL (ref 13.0–17.0)
Hemoglobin: 13.9 g/dL (ref 13.0–17.0)
Sodium: 128 mEq/L — ABNORMAL LOW (ref 135–145)
TCO2: 26 mmol/L (ref 0–100)
TCO2: 23 mmol/L (ref 0–100)

## 2011-06-12 LAB — POCT I-STAT, CHEM 8
Calcium, Ion: 1.12 mmol/L (ref 1.12–1.32)
Chloride: 96 mEq/L (ref 96–112)
Glucose, Bld: 105 mg/dL — ABNORMAL HIGH (ref 70–99)
Glucose, Bld: 97 mg/dL (ref 70–99)
HCT: 41 % (ref 39.0–52.0)
HCT: 42 % (ref 39.0–52.0)
Hemoglobin: 13.9 g/dL (ref 13.0–17.0)
Hemoglobin: 14.3 g/dL (ref 13.0–17.0)
Potassium: 3.8 mEq/L (ref 3.5–5.1)
TCO2: 24 mmol/L (ref 0–100)
TCO2: 25 mmol/L (ref 0–100)

## 2011-06-18 ENCOUNTER — Emergency Department (HOSPITAL_COMMUNITY)
Admission: EM | Admit: 2011-06-18 | Discharge: 2011-06-18 | Disposition: A | Discharge status: Home / Self Care | Payer: Medicaid Other | Attending: Emergency Medicine | Admitting: Emergency Medicine

## 2011-06-18 DIAGNOSIS — F840 Autistic disorder: Secondary | ICD-10-CM | POA: Insufficient documentation

## 2011-06-18 DIAGNOSIS — R5381 Other malaise: Secondary | ICD-10-CM | POA: Insufficient documentation

## 2011-06-18 DIAGNOSIS — R569 Unspecified convulsions: Secondary | ICD-10-CM | POA: Insufficient documentation

## 2011-06-18 DIAGNOSIS — F79 Unspecified intellectual disabilities: Secondary | ICD-10-CM | POA: Insufficient documentation

## 2011-06-18 DIAGNOSIS — E871 Hypo-osmolality and hyponatremia: Secondary | ICD-10-CM | POA: Insufficient documentation

## 2011-06-18 LAB — POCT I-STAT, CHEM 8
BUN: 9 mg/dL (ref 6–23)
Calcium, Ion: 1.19 mmol/L (ref 1.12–1.32)
HCT: 43 % (ref 39.0–52.0)
Hemoglobin: 14.6 g/dL (ref 13.0–17.0)
Sodium: 127 mEq/L — ABNORMAL LOW (ref 135–145)
TCO2: 27 mmol/L (ref 0–100)

## 2011-06-19 ENCOUNTER — Inpatient Hospital Stay (INDEPENDENT_AMBULATORY_CARE_PROVIDER_SITE_OTHER)
Admission: RE | Admit: 2011-06-19 | Discharge: 2011-06-19 | Disposition: A | Discharge status: Home / Self Care | Payer: No Typology Code available for payment source | Source: Ambulatory Visit | Attending: Family Medicine | Admitting: Family Medicine

## 2011-06-19 DIAGNOSIS — E871 Hypo-osmolality and hyponatremia: Secondary | ICD-10-CM

## 2011-06-19 LAB — POCT I-STAT, CHEM 8
HCT: 47 % (ref 39.0–52.0)
Hemoglobin: 16 g/dL (ref 13.0–17.0)
Potassium: 4.4 mEq/L (ref 3.5–5.1)
Sodium: 130 mEq/L — ABNORMAL LOW (ref 135–145)
TCO2: 24 mmol/L (ref 0–100)

## 2014-08-09 ENCOUNTER — Encounter (HOSPITAL_BASED_OUTPATIENT_CLINIC_OR_DEPARTMENT_OTHER): Payer: Self-pay

## 2014-08-09 ENCOUNTER — Emergency Department (HOSPITAL_BASED_OUTPATIENT_CLINIC_OR_DEPARTMENT_OTHER)
Admission: EM | Admit: 2014-08-09 | Discharge: 2014-08-09 | Disposition: A | Discharge status: Home / Self Care | Payer: Medicaid Other | Attending: Emergency Medicine | Admitting: Emergency Medicine

## 2014-08-09 DIAGNOSIS — R5383 Other fatigue: Secondary | ICD-10-CM | POA: Diagnosis present

## 2014-08-09 DIAGNOSIS — F840 Autistic disorder: Secondary | ICD-10-CM | POA: Diagnosis not present

## 2014-08-09 DIAGNOSIS — E871 Hypo-osmolality and hyponatremia: Secondary | ICD-10-CM | POA: Diagnosis not present

## 2014-08-09 DIAGNOSIS — R251 Tremor, unspecified: Secondary | ICD-10-CM | POA: Diagnosis not present

## 2014-08-09 DIAGNOSIS — Z8639 Personal history of other endocrine, nutritional and metabolic disease: Secondary | ICD-10-CM | POA: Insufficient documentation

## 2014-08-09 DIAGNOSIS — Z8669 Personal history of other diseases of the nervous system and sense organs: Secondary | ICD-10-CM | POA: Diagnosis not present

## 2014-08-09 DIAGNOSIS — Z79899 Other long term (current) drug therapy: Secondary | ICD-10-CM

## 2014-08-09 HISTORY — DX: Autistic disorder: F84.0

## 2014-08-09 HISTORY — DX: Intermittent explosive disorder: F63.81

## 2014-08-09 HISTORY — DX: Allergic rhinitis, unspecified: J30.9

## 2014-08-09 HISTORY — DX: Unspecified astigmatism, unspecified eye: H52.209

## 2014-08-09 HISTORY — DX: Hypo-osmolality and hyponatremia: E87.1

## 2014-08-09 HISTORY — DX: Moderate intellectual disabilities: F71

## 2014-08-09 HISTORY — DX: Unspecified convulsions: R56.9

## 2014-08-09 LAB — BASIC METABOLIC PANEL
ANION GAP: 11 (ref 5–15)
BUN: 7 mg/dL (ref 6–23)
CALCIUM: 8.8 mg/dL (ref 8.4–10.5)
CO2: 26 mEq/L (ref 19–32)
CREATININE: 0.9 mg/dL (ref 0.50–1.35)
Chloride: 89 mEq/L — ABNORMAL LOW (ref 96–112)
GFR calc Af Amer: >90 mL/min (ref >90)
Glucose, Bld: 90 mg/dL (ref 70–99)
Potassium: 4.2 mEq/L (ref 3.7–5.3)
Sodium: 126 mEq/L — ABNORMAL LOW (ref 137–147)

## 2014-08-09 LAB — CBC WITH DIFFERENTIAL/PLATELET
BASOS ABS: 0 10*3/uL (ref 0.0–0.1)
Basophils Relative: 1 % (ref 0–1)
Eosinophils Absolute: 0.4 10*3/uL (ref 0.0–0.7)
Eosinophils Relative: 9 % — ABNORMAL HIGH (ref 0–5)
HCT: 38.4 % — ABNORMAL LOW (ref 39.0–52.0)
Hemoglobin: 13.6 g/dL (ref 13.0–17.0)
Lymphocytes Relative: 31 % (ref 12–46)
Lymphs Abs: 1.5 10*3/uL (ref 0.7–4.0)
MCH: 27.7 pg (ref 26.0–34.0)
MCHC: 35.4 g/dL (ref 30.0–36.0)
MCV: 78.2 fL (ref 78.0–100.0)
Monocytes Absolute: 0.8 10*3/uL (ref 0.1–1.0)
Monocytes Relative: 17 % — ABNORMAL HIGH (ref 3–12)
NEUTROS ABS: 2.1 10*3/uL (ref 1.7–7.7)
NEUTROS PCT: 42 % — AB (ref 43–77)
Platelets: 150 10*3/uL (ref 150–400)
RBC: 4.91 MIL/uL (ref 4.22–5.81)
RDW: 13 % (ref 11.5–15.5)
WBC: 4.9 10*3/uL (ref 4.0–10.5)

## 2014-08-09 LAB — URINALYSIS, ROUTINE W REFLEX MICROSCOPIC
Bilirubin Urine: NEGATIVE
Glucose, UA: NEGATIVE mg/dL
Hgb urine dipstick: NEGATIVE
Ketones, ur: NEGATIVE mg/dL
LEUKOCYTES UA: NEGATIVE
NITRITE: NEGATIVE
Protein, ur: NEGATIVE mg/dL
Specific Gravity, Urine: 1.014 (ref 1.005–1.030)
UROBILINOGEN UA: 1 mg/dL (ref 0.0–1.0)
pH: 6 (ref 5.0–8.0)

## 2014-08-09 LAB — RAPID URINE DRUG SCREEN, HOSP PERFORMED
Amphetamines: NOT DETECTED
BARBITURATES: NOT DETECTED
Benzodiazepines: NOT DETECTED
COCAINE: NOT DETECTED
OPIATES: NOT DETECTED
TETRAHYDROCANNABINOL: NOT DETECTED

## 2014-08-09 LAB — CARBAMAZEPINE LEVEL, TOTAL: Carbamazepine Lvl: 6.6 ug/mL (ref 4.0–12.0)

## 2014-08-09 MED ORDER — SODIUM CHLORIDE 0.9 % IV BOLUS (SEPSIS)
1000.0000 mL | Freq: Once | INTRAVENOUS | Status: AC
  Administered 2014-08-09: 1000 mL via INTRAVENOUS

## 2014-08-09 NOTE — Discharge Instructions (Signed)
Please read and follow all provided instructions.  Your diagnoses today include:  1. Other fatigue   2. Hyponatremia   3. Polypharmacy     Tests performed today include:  Blood counts and electrolytes - Sodium was 126  Carbamazepine level - normal  Urine test - no infection  Vital signs. See below for your results today.   Medications prescribed:   None  Take any prescribed medications only as directed.  Home care instructions:  Follow any educational materials contained in this packet.  Follow-up instructions: Please follow-up with your primary care provider in the next 3 days for further evaluation of your symptoms.   Your symptoms today may be caused by taking multiple medications with sedating side effects. Also, you are on medications which can cause your sodium to be low including carbamazepine and fluvoxamine. Please follow-up with your primary care physician in the next 3 days to discuss your medications and to determine the cause of chronic low sodium level.  Return instructions:   Please return to the Emergency Department if you experience worsening symptoms.   Please return if you have any other emergent concerns.  Additional Information:  Your vital signs today were: BP 121/77 mmHg   Pulse 88   Temp(Src) 97.8 F (36.6 C) (Oral)   Resp 16   SpO2 99% If your blood pressure (BP) was elevated above 135/85 this visit, please have this repeated by your doctor within one month. --------------

## 2014-08-09 NOTE — ED Notes (Signed)
Sent from group home b/c pcp concerned d/t fatigue, weakness and delayed reaction.  Provider with him states pt is currently at baseline for himself, interactive and following commands for triage.  Hx of hyponatremia per pcp note.

## 2014-08-09 NOTE — ED Provider Notes (Signed)
CSN: [id number]     Arrival date & time 08/09/14  1124 History   First MD Initiated Contact with Patient 08/09/14 1248     Chief Complaint  Patient presents with  . Fatigue     (Consider location/radiation/quality/duration/timing/severity/associated sxs/prior Treatment) HPI Comments: Patient living in a group home with history of hyponatremia, autism presents from the group home with concern for altered mental status, slow responses. Patient himself has no complaints. He states that he stayed up late last night. Per caregiver at bedside, patient is normally more alert and active. No known injuries. Patient denies pain. No diarrhea. No known drug use. Medication list obtained: medications include benztropine, carbamazepine, fluvoxamine (SSRI), keppra, lorazepam, thioridazine (1st gen antipsychiotic).   The history is provided by the patient, medical records and a caregiver.    Past Medical History  Diagnosis Date  . Autism   . Hyponatremia   . Intermittent explosive disorder   . Moderate intellectual disabilities   . Allergic rhinitis   . Seizures   . Astigmatism    History reviewed. No pertinent past surgical history. No family history on file. History  Substance Use Topics  . Smoking status: Never Smoker   . Smokeless tobacco: Not on file  . Alcohol Use: No    Review of Systems  Constitutional: Positive for fatigue. Negative for fever.  HENT: Negative for rhinorrhea and sore throat.   Eyes: Negative for redness.  Respiratory: Negative for cough.   Cardiovascular: Negative for chest pain.  Gastrointestinal: Negative for nausea, vomiting, abdominal pain and diarrhea.  Genitourinary: Negative for dysuria.  Musculoskeletal: Negative for myalgias.  Skin: Negative for rash.  Neurological: Negative for headaches.    Allergies  Review of patient's allergies indicates no known allergies.  Home Medications   Prior to Admission medications   Not on File   BP 121/77 mmHg   Pulse 88  Temp(Src) 97.8 F (36.6 C) (Oral)  Resp 16  SpO2 99%   Physical Exam  Constitutional: He appears well-developed and well-nourished.  HENT:  Head: Normocephalic and atraumatic.  Right Ear: External ear normal.  Left Ear: External ear normal.  Nose: Nose normal.  Mouth/Throat: Oropharynx is clear and moist. No oropharyngeal exudate.  No signs of head trauma.  Eyes: Conjunctivae are normal. Right eye exhibits no discharge. Left eye exhibits no discharge. No scleral icterus. Pupils are equal.  Pupils dilated.  Neck: Normal range of motion. Neck supple.  Cardiovascular: Normal rate, regular rhythm and normal heart sounds.   No murmur heard. Pulmonary/Chest: Effort normal and breath sounds normal. No respiratory distress. He has no wheezes. He has no rales.  Abdominal: Soft. There is no tenderness. There is no rebound and no guarding.  Musculoskeletal: He exhibits no edema or tenderness.  Neurological: He is alert. He displays tremor (Slight, hands and feet). No cranial nerve deficit or sensory deficit. He exhibits normal muscle tone. He displays no seizure activity. Coordination normal. GCS eye subscore is 4. GCS verbal subscore is 5. GCS motor subscore is 6.  Patient rouses to voice, quickly falls back to sleep unless he is continuously stimulated.   Skin: Skin is warm and dry.  Psychiatric: He has a normal mood and affect.  Nursing note and vitals reviewed.   ED Course  Procedures (including critical care time) Labs Review Labs Reviewed  BASIC METABOLIC PANEL - Abnormal; Notable for the following:    Sodium 126 (*)    Chloride 89 (*)    All other components within  normal limits  CBC WITH DIFFERENTIAL - Abnormal; Notable for the following:    HCT 38.4 (*)    Neutrophils Relative % 42 (*)    Monocytes Relative 17 (*)    Eosinophils Relative 9 (*)    All other components within normal limits  URINALYSIS, ROUTINE W REFLEX MICROSCOPIC  CARBAMAZEPINE LEVEL, TOTAL  URINE  RAPID DRUG SCREEN (HOSP PERFORMED)    Imaging Review No results found.   EKG Interpretation None      1:43 PM Patient seen and examined. Work-up initiated. Medications ordered.   Vital signs reviewed and are as follows: BP 121/77 mmHg  Pulse 88  Temp(Src) 97.8 F (36.6 C) (Oral)  Resp 16  SpO2 99%   4:51 PM Patient is much more responsive now. He is talking and at baseline per caregiver. He has eaten a meal and has ambulated to the bathroom without difficulty. Feel that he is safe to discharge. He has close oversight and is well cared for. He has been treated with 2L normal saline.   Patient/caregiver urged to return with worsening symptoms or other concerns. Patient verbalized understanding and agrees with plan.    MDM   Final diagnoses:  Other fatigue  Hyponatremia   Fatigue: Do not suspect infection. Pt states he just stayed up last night. I suspect that being on multiple sedating mediations is contributing. He is back at baseline after napping and receiving fluids in ED.   He will need close PCP f/u to determine etiology of chronic hyponatremia (Luvox, Carbamazepine) and to adjust medications if necessary.     Renne CriglerJoshua Sho Salguero, PA-C 08/09/14 1659  Rolan BuccoMelanie Belfi, MD 08/11/14 [phone]

## 2014-10-13 ENCOUNTER — Emergency Department (HOSPITAL_BASED_OUTPATIENT_CLINIC_OR_DEPARTMENT_OTHER)
Admission: EM | Admit: 2014-10-13 | Discharge: 2014-10-13 | Disposition: A | Discharge status: Home / Self Care | Payer: Medicaid Other | Attending: Emergency Medicine | Admitting: Emergency Medicine

## 2014-10-13 ENCOUNTER — Encounter (HOSPITAL_BASED_OUTPATIENT_CLINIC_OR_DEPARTMENT_OTHER): Payer: Self-pay

## 2014-10-13 DIAGNOSIS — G40909 Epilepsy, unspecified, not intractable, without status epilepticus: Secondary | ICD-10-CM | POA: Insufficient documentation

## 2014-10-13 DIAGNOSIS — F840 Autistic disorder: Secondary | ICD-10-CM | POA: Diagnosis not present

## 2014-10-13 DIAGNOSIS — Z79899 Other long term (current) drug therapy: Secondary | ICD-10-CM | POA: Insufficient documentation

## 2014-10-13 DIAGNOSIS — T50901A Poisoning by unspecified drugs, medicaments and biological substances, accidental (unintentional), initial encounter: Secondary | ICD-10-CM

## 2014-10-13 DIAGNOSIS — Z8639 Personal history of other endocrine, nutritional and metabolic disease: Secondary | ICD-10-CM | POA: Diagnosis not present

## 2014-10-13 LAB — COMPREHENSIVE METABOLIC PANEL
ALK PHOS: 60 U/L (ref 39–117)
ALT: 21 U/L (ref 0–53)
AST: 27 U/L (ref 0–37)
Albumin: 4.4 g/dL (ref 3.5–5.2)
Anion gap: <3 — ABNORMAL LOW (ref 5–15)
BUN: 9 mg/dL (ref 6–23)
CALCIUM: 9.2 mg/dL (ref 8.4–10.5)
CO2: 26 mmol/L (ref 19–32)
Chloride: 100 mmol/L (ref 96–112)
Creatinine, Ser: 1.06 mg/dL (ref 0.50–1.35)
GLUCOSE: 82 mg/dL (ref 70–99)
POTASSIUM: 4.3 mmol/L (ref 3.5–5.1)
SODIUM: 128 mmol/L — AB (ref 135–145)
Total Bilirubin: 0.4 mg/dL (ref 0.3–1.2)
Total Protein: 7.5 g/dL (ref 6.0–8.3)

## 2014-10-13 LAB — URINALYSIS, ROUTINE W REFLEX MICROSCOPIC
Bilirubin Urine: NEGATIVE
Glucose, UA: NEGATIVE mg/dL
Hgb urine dipstick: NEGATIVE
Ketones, ur: NEGATIVE mg/dL
Leukocytes, UA: NEGATIVE
Nitrite: NEGATIVE
PH: 7 (ref 5.0–8.0)
PROTEIN: NEGATIVE mg/dL
Specific Gravity, Urine: 1.011 (ref 1.005–1.030)
Urobilinogen, UA: 0.2 mg/dL (ref 0.0–1.0)

## 2014-10-13 LAB — CBC WITH DIFFERENTIAL/PLATELET
Basophils Absolute: 0 10*3/uL (ref 0.0–0.1)
Basophils Relative: 0 % (ref 0–1)
EOS ABS: 0.3 10*3/uL (ref 0.0–0.7)
EOS PCT: 7 % — AB (ref 0–5)
HEMATOCRIT: 41.7 % (ref 39.0–52.0)
Hemoglobin: 14.7 g/dL (ref 13.0–17.0)
Lymphocytes Relative: 33 % (ref 12–46)
Lymphs Abs: 1.5 10*3/uL (ref 0.7–4.0)
MCH: 27.7 pg (ref 26.0–34.0)
MCHC: 35.3 g/dL (ref 30.0–36.0)
MCV: 78.7 fL (ref 78.0–100.0)
Monocytes Absolute: 0.5 10*3/uL (ref 0.1–1.0)
Monocytes Relative: 11 % (ref 3–12)
NEUTROS ABS: 2.2 10*3/uL (ref 1.7–7.7)
NEUTROS PCT: 49 % (ref 43–77)
Platelets: 182 10*3/uL (ref 150–400)
RBC: 5.3 MIL/uL (ref 4.22–5.81)
RDW: 13.1 % (ref 11.5–15.5)
WBC: 4.6 10*3/uL (ref 4.0–10.5)

## 2014-10-13 LAB — CBG MONITORING, ED: GLUCOSE-CAPILLARY: 98 mg/dL (ref 70–99)

## 2014-10-13 MED ORDER — SODIUM CHLORIDE 0.9 % IV BOLUS (SEPSIS)
1000.0000 mL | Freq: Once | INTRAVENOUS | Status: AC
  Administered 2014-10-13: 1000 mL via INTRAVENOUS

## 2014-10-13 NOTE — ED Notes (Addendum)
Patient here with Med tech from group home after being administered incorrect morning meds. The incorrect meds patient took was Metformin-750 HCTZ-37.5 Lasix  10 Lisinopril 40 Lopressor  100 Klonopin Seroquel  Staff concerned because patient has no hx of hypertension or diabetes, but does have hx of hyponatremia.  Patient was also administered his own am meds

## 2014-10-13 NOTE — ED Notes (Signed)
Spoke with Hunter Garrett at poison control and provided patient's status, Hunter Garrett informed us that there were no long term  Or later possible effects from the drugs he took and if he remains stable at 15:30 we can discharge him.

## 2014-10-13 NOTE — Discharge Instructions (Signed)
Accidental Overdose  A drug overdose occurs when a chemical substance (drug or medication) is used in amounts large enough to overcome a person. This may result in severe illness or death. This is a type of poisoning. Accidental overdoses of medications or other substances come from a variety of reasons. When this happens accidentally, it is often because the person taking the substance does not know enough about what they have taken. Drugs which commonly cause overdose deaths are alcohol, psychotropic medications (medications which affect the mind), pain medications, illegal drugs (street drugs) such as cocaine and heroin, and multiple drugs taken at the same time. It may result from careless behavior (such as over-indulging at a party). Other causes of overdose may include multiple drug use, a lapse in memory, or drug use after a period of no drug use.   Sometimes overdosing occurs because a person cannot remember if they have taken their medication.   A common unintentional overdose in young children involves multi-vitamins containing iron. Iron is a part of the hemoglobin molecule in blood. It is used to transport oxygen to living cells. When taken in small amounts, iron allows the body to restock hemoglobin. In large amounts, it causes problems in the body. If this overdose is not treated, it can lead to death.  Never take medicines that show signs of tampering or do not seem quite right. Never take medicines in the dark or in poor lighting. Read the label and check each dose of medicine before you take it. When adults are poisoned, it happens most often through carelessness or lack of information. Taking medicines in the dark or taking medicine prescribed for someone else to treat the same type of problem is a dangerous practice.  SYMPTOMS   Symptoms of overdose depend on the medication and amount taken. They can vary from over-activity with stimulant over-dosage, to sleepiness from depressants such as  alcohol, narcotics and tranquilizers. Confusion, dizziness, nausea and vomiting may be present. If problems are severe enough coma and death may result.  DIAGNOSIS   Diagnosis and management are generally straightforward if the drug is known. Otherwise it is more difficult. At times, certain symptoms and signs exhibited by the patient, or blood tests, can reveal the drug in question.   TREATMENT   In an emergency department, most patients can be treated with supportive measures. Antidotes may be available if there has been an overdose of opioids or benzodiazepines. A rapid improvement will often occur if this is the cause of overdose.  At home or away from medical care:   There may be no immediate problems or warning signs in children.   Not everything works well in all cases of poisoning.   Take immediate action. Poisons may act quickly.   If you think someone has swallowed medicine or a household product, and the person is unconscious, having seizures (convulsions), or is not breathing, immediately call for an ambulance.  IF a person is conscious and appears to be doing OK but has swallowed a poison:   Do not wait to see what effect the poison will have. Immediately call a poison control center (listed in the white pages of your telephone book under "Poison Control" or inside the front cover with other emergency numbers). Some poison control centers have TTY capability for the deaf. Check with your local center if you or someone in your family requires this service.   Keep the container so you can read the label on the product for ingredients.     Describe what, when, and how much was taken and the age and condition of the person poisoned. Inform them if the person is vomiting, choking, drowsy, shows a change in color or temperature of skin, is conscious or unconscious, or is convulsing.   Do not cause vomiting unless instructed by medical personnel. Do not induce vomiting or force liquids into a person who  is convulsing, unconscious, or very drowsy.  Stay calm and in control.    Activated charcoal also is sometimes used in certain types of poisoning and you may wish to add a supply to your emergency medicines. It is available without a prescription. Call a poison control center before using this medication.  PREVENTION   Thousands of children die every year from unintentional poisoning. This may be from household chemicals, poisoning from carbon monoxide in a car, taking their parent's medications, or simply taking a few iron pills or vitamins with iron. Poisoning comes from unexpected sources.   Store medicines out of the sight and reach of children, preferably in a locked cabinet. Do not keep medications in a food cabinet. Always store your medicines in a secure place. Get rid of expired medications.   If you have children living with you or have them as occasional guests, you should have child-resistant caps on your medicine containers. Keep everything out of reach. Child proof your home.   If you are called to the telephone or to answer the door while you are taking a medicine, take the container with you or put the medicine out of the reach of small children.   Do not take your medication in front of children. Do not tell your child how good a medication is and how good it is for them. They may get the idea it is more of a treat.   If you are an adult and have accidentally taken an overdose, you need to consider how this happened and what can be done to prevent it from happening again. If this was from a street drug or alcohol, determine if there is a problem that needs addressing. If you are not sure a problems exists, it is easy to talk to a professional and ask them if they think you have a problem. It is better to handle this problem in this way before it happens again and has a much worse consequence.  Document Released: 11/06/2004 Document Revised: 11/15/2011 Document Reviewed: 04/14/2009  [employer]  Patient Information 2015 [employer]. This information is not intended to replace advice given to you by your health care provider. Make sure you discuss any questions you have with your health care provider.

## 2014-10-13 NOTE — ED Provider Notes (Signed)
CSN: [id number]     Arrival date & time 10/13/14  1100 History   First MD Initiated Contact with Patient 10/13/14 1119     Chief Complaint  Patient presents with  . accidental OD      (Consider location/radiation/quality/duration/timing/severity/associated sxs/prior Treatment) Patient is a 34 y.o. male presenting with Ingested Medication.  Ingestion This is a new problem. Episode onset: 3 hours PTA. The problem occurs constantly. The problem has not changed since onset.Pertinent negatives include no chest pain, no abdominal pain, no headaches and no shortness of breath. Nothing aggravates the symptoms. Nothing relieves the symptoms. He has tried nothing for the symptoms.    Past Medical History  Diagnosis Date  . Autism   . Hyponatremia   . Intermittent explosive disorder   . Moderate intellectual disabilities   . Allergic rhinitis   . Seizures   . Astigmatism    History reviewed. No pertinent past surgical history. No family history on file. History  Substance Use Topics  . Smoking status: Never Smoker   . Smokeless tobacco: Not on file  . Alcohol Use: No    Review of Systems  Respiratory: Negative for shortness of breath.   Cardiovascular: Negative for chest pain.  Gastrointestinal: Negative for abdominal pain.  Neurological: Negative for headaches.  All other systems reviewed and are negative.     Allergies  Review of patient's allergies indicates no known allergies.  Home Medications   Prior to Admission medications   Medication Sig Start Date End Date Taking? Authorizing Provider  benztropine (COGENTIN) 1 MG tablet Take 1 mg by mouth 2 (two) times daily.   Yes Historical Provider, MD  carbamazepine (TEGRETOL) 100 MG chewable tablet Chew 50 mg by mouth 2 (two) times daily.   Yes Historical Provider, MD  fluvoxaMINE (LUVOX) 50 MG tablet Take 50 mg by mouth at bedtime.   Yes Historical Provider, MD  levETIRAcetam (KEPPRA) 1000 MG tablet Take 1,000 mg by mouth 2  (two) times daily.   Yes Historical Provider, MD  LORazepam (ATIVAN) 1 MG tablet Take 1 mg by mouth 3 (three) times daily.   Yes Historical Provider, MD  mineral oil-hydrophilic petrolatum (AQUAPHOR) ointment Apply 1 application topically daily as needed for dry skin.   Yes Historical Provider, MD  omeprazole (PRILOSEC) 20 MG capsule Take 20 mg by mouth daily.   Yes Historical Provider, MD  thioridazine (MELLARIL) 25 MG tablet Take 75 mg by mouth daily.   Yes Historical Provider, MD   BP 112/81 mmHg  Pulse 58  Temp(Src) 97.6 F (36.4 C) (Oral)  Resp 16  SpO2 100% Physical Exam  Constitutional: He is oriented to person, place, and time. He appears well-developed and well-nourished.  HENT:  Head: Normocephalic and atraumatic.  Eyes: Conjunctivae and EOM are normal.  Neck: Normal range of motion. Neck supple.  Cardiovascular: Normal rate, regular rhythm and normal heart sounds.   Pulmonary/Chest: Effort normal and breath sounds normal. No respiratory distress.  Abdominal: He exhibits no distension. There is no tenderness. There is no rebound and no guarding.  Musculoskeletal: Normal range of motion.  Neurological: He is alert and oriented to person, place, and time.  Skin: Skin is warm and dry.  Vitals reviewed.   ED Course  Procedures (including critical care time) Labs Review Labs Reviewed  COMPREHENSIVE METABOLIC PANEL - Abnormal; Notable for the following:    Sodium 128 (*)    Anion gap <3 (*)    All other components within normal limits  CBC WITH DIFFERENTIAL/PLATELET - Abnormal; Notable for the following:    Eosinophils Relative 7 (*)    All other components within normal limits  URINALYSIS, ROUTINE W REFLEX MICROSCOPIC  CBG MONITORING, ED    Imaging Review No results found.   EKG Interpretation   Date/Time:  Sunday October 13 2014 11:33:37 EST Ventricular Rate:  66 PR Interval:  152 QRS Duration: 88 QT Interval:  422 QTC Calculation: 442 R Axis:   75 Text  Interpretation:  Normal sinus rhythm Normal ECG No significant change  since last tracing Confirmed by Mirian MoGentry, Matthew [phone]([id number]) on 10/13/2014  12:59:03 PM      MDM   Final diagnoses:  Accidental drug ingestion, initial encounter    34 y.o. male with pertinent PMH of intermittent explosive do, seizures presents after taking another patient in the group home's medicine along with his own.  On arrival, pt hypotensive, but with intact neuro status, well appearing, very mildly drowsy.  We spoke with poison control who recommended 3 hours more of monitoring, but that the pt was probably metabolizing all of the medications (listed separately in nursing notes).    After monitoring, pt at baseline, normotensive.  DC home in stable condition.  I have reviewed all laboratory and imaging studies if ordered as above  1. Accidental drug ingestion, initial encounter         Mirian MoMatthew Gentry, MD 10/14/14 [phone]

## 2014-10-13 NOTE — ED Notes (Signed)
[facility] and the following recommendations given. Cardiac Monitoring and fluid bolus, check electrolytes and monitor BP and HR x 4 hours.
